# Patient Record
Sex: Female | Born: 1980 | Hispanic: Yes | Marital: Married | Smoking: Never smoker
Health system: Southern US, Community
[De-identification: ages and names within clinical notes are randomized; demographics above are authoritative.]

## PROBLEM LIST (undated history)

## (undated) DIAGNOSIS — I10 Essential (primary) hypertension: Secondary | ICD-10-CM

## (undated) DIAGNOSIS — R42 Dizziness and giddiness: Secondary | ICD-10-CM

## (undated) DIAGNOSIS — J329 Chronic sinusitis, unspecified: Secondary | ICD-10-CM

## (undated) HISTORY — DX: Chronic sinusitis, unspecified: J32.9

## (undated) HISTORY — DX: Dizziness and giddiness: R42

## (undated) HISTORY — DX: Essential (primary) hypertension: I10

---

## 2004-11-27 ENCOUNTER — Ambulatory Visit: Payer: Self-pay | Admitting: Family Medicine

## 2005-11-21 ENCOUNTER — Ambulatory Visit: Payer: Self-pay

## 2006-01-28 HISTORY — PX: BREAST BIOPSY: SHX20

## 2012-06-02 ENCOUNTER — Ambulatory Visit: Payer: Self-pay | Admitting: Family Medicine

## 2012-12-16 ENCOUNTER — Ambulatory Visit: Payer: Self-pay

## 2012-12-28 ENCOUNTER — Encounter: Payer: Self-pay | Admitting: *Deleted

## 2012-12-28 ENCOUNTER — Ambulatory Visit: Payer: Self-pay

## 2013-01-05 ENCOUNTER — Ambulatory Visit: Payer: Self-pay | Admitting: General Surgery

## 2013-02-04 ENCOUNTER — Ambulatory Visit (INDEPENDENT_AMBULATORY_CARE_PROVIDER_SITE_OTHER): Payer: PRIVATE HEALTH INSURANCE | Admitting: General Surgery

## 2013-02-04 ENCOUNTER — Encounter: Payer: Self-pay | Admitting: General Surgery

## 2013-02-04 VITALS — BP 122/64 | HR 76 | Resp 12 | Ht 64.0 in | Wt 160.0 lb

## 2013-02-04 DIAGNOSIS — M940 Chondrocostal junction syndrome [Tietze]: Secondary | ICD-10-CM

## 2013-02-04 DIAGNOSIS — N63 Unspecified lump in unspecified breast: Secondary | ICD-10-CM

## 2013-02-04 DIAGNOSIS — N644 Mastodynia: Secondary | ICD-10-CM

## 2013-02-04 NOTE — Patient Instructions (Addendum)
The patient is aware to use an antiinflammatory of choice (Advil or Aleve) as needed for comfort.Patient to return in six months right breast ultrasound.

## 2013-02-04 NOTE — Progress Notes (Signed)
Patient ID: Shelby Terrell, female   DOB: 06-Jun-1980, 33 y.o.   MRN: [id number]  Chief Complaint  Patient presents with  . Other    mammogram    HPI Shelby Terrell is a 33 y.o. female who presents for a breast evaluation. The most recent mammogram and ultrasound was done on 12/16/12 cat 3. Patient does perform regular self breast checks and gets regular mammograms done.  Patient states she feels a lump in her right breast and there is some pain, only last for a few seconds.She states the area has been tender for about six months. Patient had a right breast biopsy done in 2008 on the same right but in a different area. This was completed at [facility]. Results are not available. 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 This involves extensive upper extremity repetitive motion.  While the patient is a native of [place], her English is excellent. She is accompanied today by a [facility]opt[facility] translator [facility]. HPI  Past Medical History  Diagnosis Date  . Sinus infection   . Hypertension     Past Surgical History  Procedure Laterality Date  . [facility] Right [facility]    [facility]     Family History  Problem Relation Age of Onset  . Breast cancer Mother     passed at the age of 32.   . Breast cancer Maternal Aunt     Social History History  Substance Use Topics  . Smoking status: Never Smoker   . Smokeless tobacco: Never Used  . Alcohol Use: No    No Known Allergies  No current outpatient prescriptions on file.   No current facility-administered medications for this visit.    Review of Systems Review of Systems  Constitutional: Negative.   Respiratory: Negative.   Cardiovascular: Negative.     Blood pressure 122/64, pulse 76, resp. rate 12, height 5\' 4"  (1.626 m), weight 160 lb (72.576 kg), last menstrual period 01/16/2013.  Physical Exam Physical Exam  Constitutional: She is oriented to person, place, and time. She appears well-developed and  well-nourished.  Eyes: Conjunctivae are normal. No scleral icterus.  Neck: Neck supple.  Cardiovascular: Normal rate, regular rhythm and normal heart sounds.   Pulmonary/Chest: Effort normal and breath sounds normal. Right breast exhibits no inverted nipple, no mass, no nipple discharge, no skin change and no tenderness. Left breast exhibits no inverted nipple, no mass, no nipple discharge, no skin change and no tenderness.    Tender in the lower inner quadrant of the right breast   Lymphadenopathy:    She has no cervical adenopathy.    She has no axillary adenopathy.  Neurological: She is alert and oriented to person, place, and time.  Skin: Skin is warm.    Data Reviewed [facility]  Mammogram and ultrasound dated December 16, 2012 was reviewed. The mammograms were unremarkable. Ultrasound showed a 1 cm hypoechoic area smaller than noted in 2006. 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 At 3:00, 7 cm from the nipple a less than 0.9 cm hypoechoic area thought to represent fibrocystic changes was identified. This was recommended for a 6 month followup ultrasound exam. Consideration for MRI was offered by the radiologist.  Dayna BarkerGail Model testing is appropriate for those over age 32. If the patient was 2635, her estimated risk for breast cancer (lifetime) is less than 17%, below 20% threshold for American Cancer Society MRI screening recommendations.  Assessment  Costochondritis as the source of the patient's breast pain.     Plan    A followup office exam and ultrasound will be completed in 6 months        Earline Mayotte 02/06/2013, 7:26 AM

## 2013-02-06 DIAGNOSIS — N644 Mastodynia: Secondary | ICD-10-CM | POA: Insufficient documentation

## 2013-02-06 DIAGNOSIS — M940 Chondrocostal junction syndrome [Tietze]: Secondary | ICD-10-CM | POA: Insufficient documentation

## 2013-02-06 DIAGNOSIS — N63 Unspecified lump in unspecified breast: Secondary | ICD-10-CM | POA: Insufficient documentation

## 2013-08-04 ENCOUNTER — Other Ambulatory Visit: Payer: PRIVATE HEALTH INSURANCE

## 2013-08-04 ENCOUNTER — Ambulatory Visit (INDEPENDENT_AMBULATORY_CARE_PROVIDER_SITE_OTHER): Payer: PRIVATE HEALTH INSURANCE | Admitting: General Surgery

## 2013-08-04 ENCOUNTER — Encounter: Payer: Self-pay | Admitting: General Surgery

## 2013-08-04 VITALS — BP 122/72 | HR 82 | Resp 14 | Ht 64.0 in | Wt 172.0 lb

## 2013-08-04 DIAGNOSIS — N644 Mastodynia: Secondary | ICD-10-CM

## 2013-08-04 NOTE — Progress Notes (Signed)
Patient ID: Shelby Terrell, female   DOB: Jul 11, 1980, 33 y.o.   MRN: [id number]  Chief Complaint  Patient presents with  . Follow-up    office ultrasound    HPI Shelby RollsMaria Lugo Gwynneth Terrell is a 33 y.o. female.  Here today for follow up office ultrasound. She is currently 5 months pregnant. She still notices the "pitching" sensation in the right breast like before but only maybe twice a week. Admits to a small amount clear discharge right breast.  Medical translator from [facility], [place] here today for interpretation.  HPI  Past Medical History  Diagnosis Date  . Sinus infection   . Hypertension     Past Surgical History  Procedure Laterality Date  . [facility] biopsy Right [facility]    [facility]     Family History  Problem Relation Age of Onset  . Breast cancer Mother     passed at the age of 32.   . Breast cancer Maternal Aunt     Social History History  Substance Use Topics  . Smoking status: Never Smoker   . Smokeless tobacco: Never Used  . Alcohol Use: No    No Known Allergies  Current Outpatient Prescriptions  Medication Sig Dispense Refill  . Prenatal Vit-Fe Fumarate-FA (MULTIVITAMIN-PRENATAL) 27-0.8 MG TABS tablet Take 1 tablet by mouth daily at 12 noon.       No current facility-administered medications for this visit.    Review of Systems Review of Systems  Constitutional: Negative.   Respiratory: Negative.   Cardiovascular: Negative.     Blood pressure 122/72, pulse 82, resp. rate 14, height 5\' 4"  (1.626 m), weight 172 lb (78.019 kg), last menstrual period 02/26/2013.  Physical Exam Physical Exam  Constitutional: She is oriented to person, place, and time. She appears well-developed and well-nourished.  Neck: Neck supple.  Cardiovascular: Normal rate, regular rhythm and normal heart sounds.   Pulmonary/Chest: Effort normal and breath sounds normal. Right breast exhibits no inverted nipple, no mass, no nipple discharge, no skin change and no tenderness. Left breast  exhibits no inverted nipple, no mass, no nipple discharge, no skin change and no tenderness.  Lymphadenopathy:    She has no cervical adenopathy.    She has no axillary adenopathy.  Neurological: She is alert and oriented to person, place, and time.  Skin: Skin is warm and dry.    Data Reviewed Ultrasound examination of the right breast in the medial half again identifies a well circumscribed bi-lobed 0.3 x 0.4 x 0.5 cm area 6 cm from the nipple consistent with a fibroadenoma. At the 3:00 position 7 cm from the nipple no areas of architectural distortion or cystic or solid lesions are appreciated. BI-RAD-2.  Assessment    Benign breast exam.    Plan    The exam today is unremarkable, especially in light of her ongoing pregnancy.  Arrangements were made for a follow up exam in one year, earlier if the patient appreciates any changes in her breasts.     PCP: Abran RichardSelvidge, William M   Von Quint[place]ar W 08/07/2013, 9:17 AM

## 2013-08-04 NOTE — Patient Instructions (Signed)
Continue self breast exams. Call office for any new breast issues or concerns. 

## 2013-11-29 ENCOUNTER — Encounter: Payer: Self-pay | Admitting: General Surgery

## 2014-05-26 ENCOUNTER — Ambulatory Visit
Admit: 2014-05-26 | Disposition: A | Discharge status: Home / Self Care | Payer: Self-pay | Attending: Family Medicine | Admitting: Family Medicine

## 2014-07-21 ENCOUNTER — Telehealth: Payer: Self-pay | Admitting: *Deleted

## 2014-08-16 ENCOUNTER — Ambulatory Visit: Payer: PRIVATE HEALTH INSURANCE

## 2014-08-16 ENCOUNTER — Ambulatory Visit (INDEPENDENT_AMBULATORY_CARE_PROVIDER_SITE_OTHER): Payer: PRIVATE HEALTH INSURANCE | Admitting: General Surgery

## 2014-08-16 ENCOUNTER — Encounter: Payer: Self-pay | Admitting: General Surgery

## 2014-08-16 VITALS — BP 122/78 | HR 74 | Resp 12 | Ht 64.0 in | Wt 166.0 lb

## 2014-08-16 DIAGNOSIS — N644 Mastodynia: Secondary | ICD-10-CM | POA: Diagnosis not present

## 2014-08-16 NOTE — Progress Notes (Signed)
Patient ID: Shelby Terrell, female   DOB: [birth year], 34 y.o.   MRN: [id number]  Chief Complaint  Patient presents with  . Follow-up    right breast ultrasound.     HPI Shelby Terrell is a 34 y.o. female here today for a right breast ultrasound recall. Patient states she feels a pinching sensation in her right breast, only last for about 5 seconds. This occurs infrequently Patient states she has a 37nine month old baby boy and she is still producing some milk. She stop breast feeding 5  months ago.  Interpreter Marcela was present.  HPI  Past Medical History  Diagnosis Date  . Sinus infection   . Hypertension     Past Surgical History  Procedure Laterality Date  . [facility] biopsy Right [facility]    [facility]     Family History  Problem Relation Age of Onset  . Breast cancer Mother     passed at the age of 33.   . Breast cancer Maternal Aunt     Social History History  Substance Use Topics  . Smoking status: Never Smoker   . Smokeless tobacco: Never Used  . Alcohol Use: No    No Known Allergies  Current Outpatient Prescriptions  Medication Sig Dispense Refill  . fluticasone (FLONASE) 50 MCG/ACT nasal spray 2 sprays by Each Nare route daily.    Marland Kitchen. loratadine (CLARITIN) 10 MG tablet Take 10 mg by mouth.    . ranitidine (ZANTAC) 150 MG tablet Take 150 mg by mouth.     No current facility-administered medications for this visit.    Review of Systems Review of Systems  Constitutional: Negative.   Respiratory: Negative.   Cardiovascular: Negative.     Blood pressure 122/78, pulse 74, resp. rate 12, height 5\' 4"  (1.626 m), weight 166 lb (75.297 kg).  Physical Exam Physical Exam  Constitutional: She is oriented to person, place, and time. She appears well-developed and well-nourished.  HENT:  Mouth/Throat: Oropharynx is clear and moist. No oropharyngeal exudate.  Eyes: Conjunctivae are normal. No scleral icterus.  Neck: Neck supple.  Cardiovascular: Normal rate,  regular rhythm and normal heart sounds.   Pulmonary/Chest: Effort normal and breath sounds normal. Right breast exhibits no inverted nipple, no mass, no nipple discharge, no skin change and no tenderness. Left breast exhibits no inverted nipple, no mass, no nipple discharge, no skin change and no tenderness.  Lymphadenopathy:    She has no cervical adenopathy.    She has no axillary adenopathy.  Neurological: She is alert and oriented to person, place, and time.  Skin: Skin is warm and dry.    Data Reviewed Ultrasound examination of the right breast shows mildly prominent ductal structures up to 0.39 cm. No intraductal lesions were noted. Normal post lactation breast parenchyma was appreciated. The previously identified 6 mm fibroadenoma in the 2:00 position is not visualized on today's exam. BI-RADS-2.  Assessment    Benign breast exam.    Plan    The patient was reassured that the milky drainage should resolve over the coming months. She should report if she experiences any new changes in her breast exam. Follow up otherwise will be on an as-needed basis.    Patient to return as needed. PCP:  Colonel BaldSelvidge   Xyon Lukasik W 08/16/2014, 9:35 PM

## 2014-08-16 NOTE — Patient Instructions (Signed)
Patient to return as needed. 

## 2014-08-24 NOTE — Telephone Encounter (Signed)
confirmed

## 2016-10-14 ENCOUNTER — Ambulatory Visit
Admission: RE | Admit: 2016-10-14 | Discharge: 2016-10-14 | Disposition: A | Discharge status: Home / Self Care | Payer: Self-pay | Source: Ambulatory Visit | Attending: Oncology | Admitting: Oncology

## 2016-10-14 ENCOUNTER — Encounter (INDEPENDENT_AMBULATORY_CARE_PROVIDER_SITE_OTHER): Payer: Self-pay

## 2016-10-14 ENCOUNTER — Ambulatory Visit: Payer: Self-pay | Attending: Oncology

## 2016-10-14 VITALS — BP 130/82 | HR 88 | Temp 99.1°F | Ht 65.0 in | Wt 168.0 lb

## 2016-10-14 DIAGNOSIS — Z0000 Encounter for general adult medical examination without abnormal findings: Secondary | ICD-10-CM

## 2016-10-14 NOTE — Progress Notes (Signed)
Subjective:     Patient ID: Shelby Terrell, female   DOB: 1980/10/27, 36 y.o.   MRN: [id number]  HPI   Review of Systems     Objective:   Physical Exam  Pulmonary/Chest: Right breast exhibits no inverted nipple, no mass, no nipple discharge, no skin change and no tenderness. Left breast exhibits no inverted nipple, no mass, no nipple discharge, no skin change and no tenderness. Breasts are symmetrical.       Assessment:     36 year old hispanic patient,fluent in English,presents for [facility] clinic visit.  Patient's mother was diagnosed with breast cancer at 75, and died at age 26.  Patient has been followed by Dr. Lemar Livings with Ultrasound.  Last ultrasound July 2016 with Birads 2 result.  Patient screened, and meets [facility] eligibility.  Patient does not have insurance, Medicare or Medicaid.  Handout given on Affordable Care Act.Instructed patient on breast self-exam using teach back method.  CBE unremarkable.  No mass or lump palpated.  Patient reports she has intermittent pinching feeling bilateral breasts.  States she  Does drink a lot of coffee.  Relationship between breast tenderness and caffeine reviewed with patient.  States she has not felt lump in right breast since her 30 year old child was born.  Patient also has a 66 year old son.  She works part-time as a Engineer, structural for elderly.    Plan:     Sent for bilateral screening mammogram.

## 2016-10-21 ENCOUNTER — Inpatient Hospital Stay
Admission: RE | Admit: 2016-10-21 | Discharge: 2016-10-21 | Disposition: A | Discharge status: Home / Self Care | Payer: Self-pay | Source: Ambulatory Visit | Attending: *Deleted | Admitting: *Deleted

## 2016-10-21 ENCOUNTER — Other Ambulatory Visit: Payer: Self-pay | Admitting: *Deleted

## 2016-10-21 DIAGNOSIS — Z9289 Personal history of other medical treatment: Secondary | ICD-10-CM

## 2016-12-01 NOTE — Progress Notes (Signed)
Letter mailed from [facility] to notify of normal mammogram results.  Patient to return in one year for annual screening.  Copy to [facility]. 

## 2017-10-08 IMAGING — MG MM DIGITAL SCREENING BILAT W/ TOMO W/ CAD
8 of 12 series · 8 of 28 positions shown · non-contrast
Comparison: Previous exam(s).

CLINICAL DATA: Screening.

EXAM:
2D DIGITAL SCREENING BILATERAL MAMMOGRAM WITH CAD AND ADJUNCT TOMO

[L CC synth-2D]
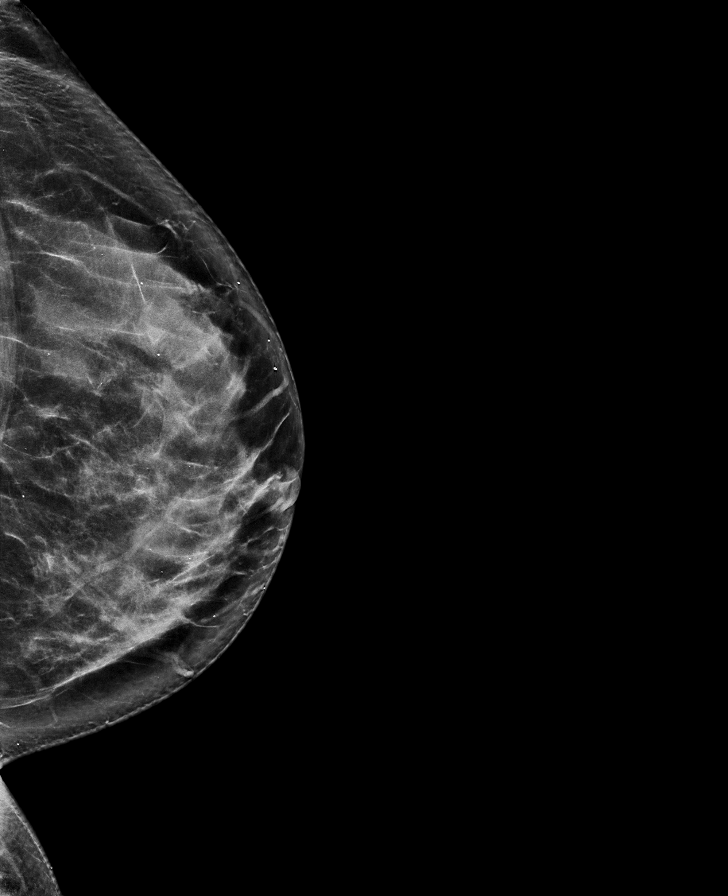

[L CC]
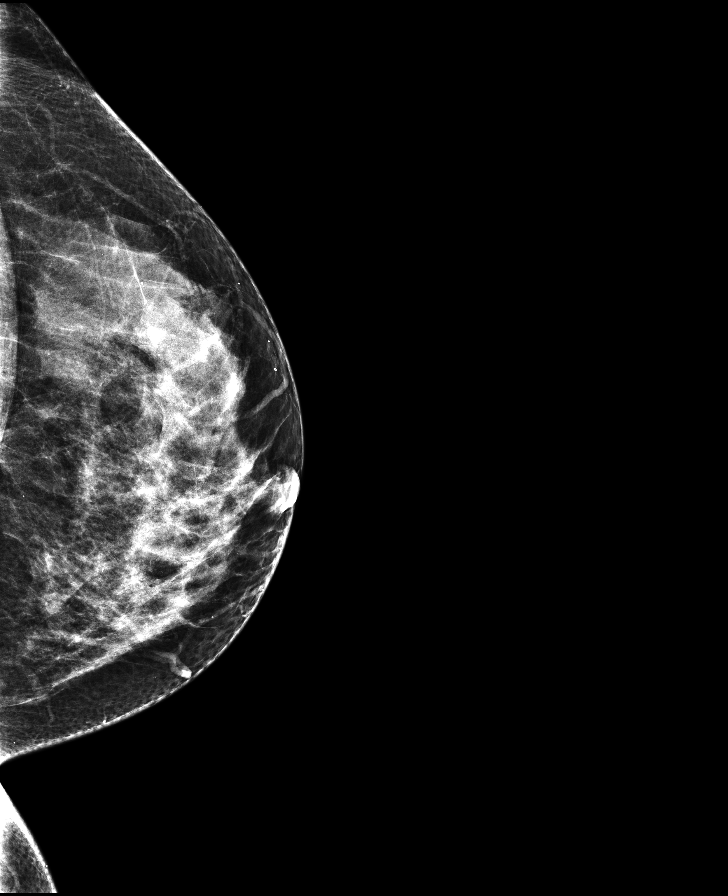

[L MLO synth-2D]
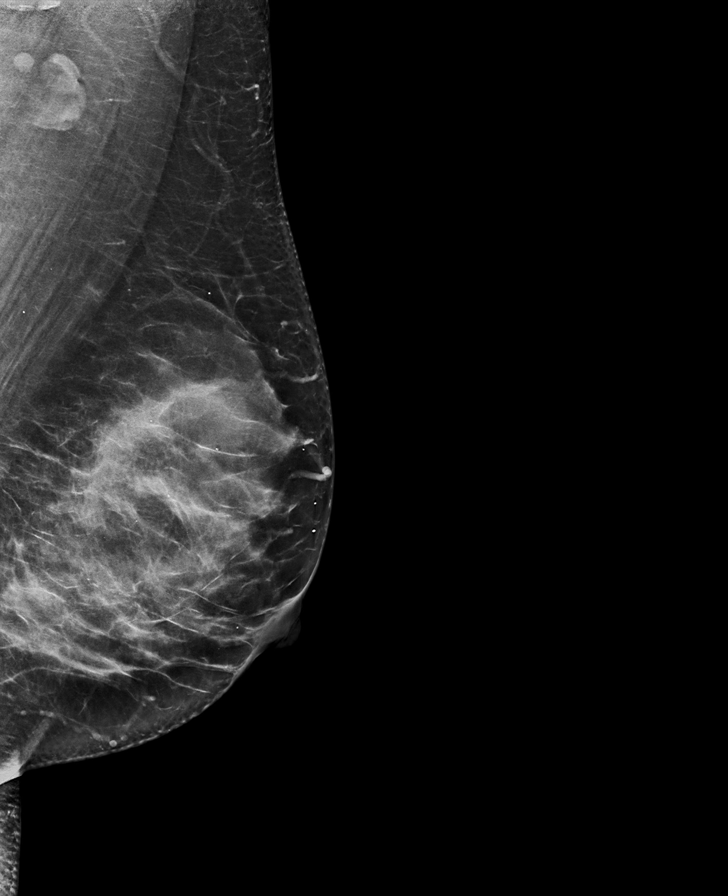

[L MLO]
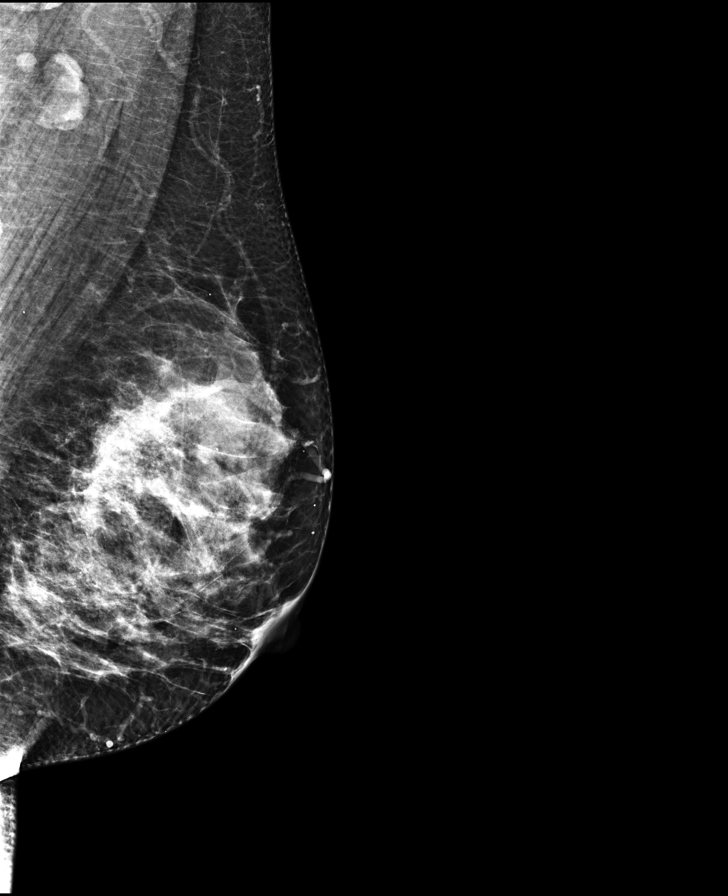

[R MLO synth-2D]
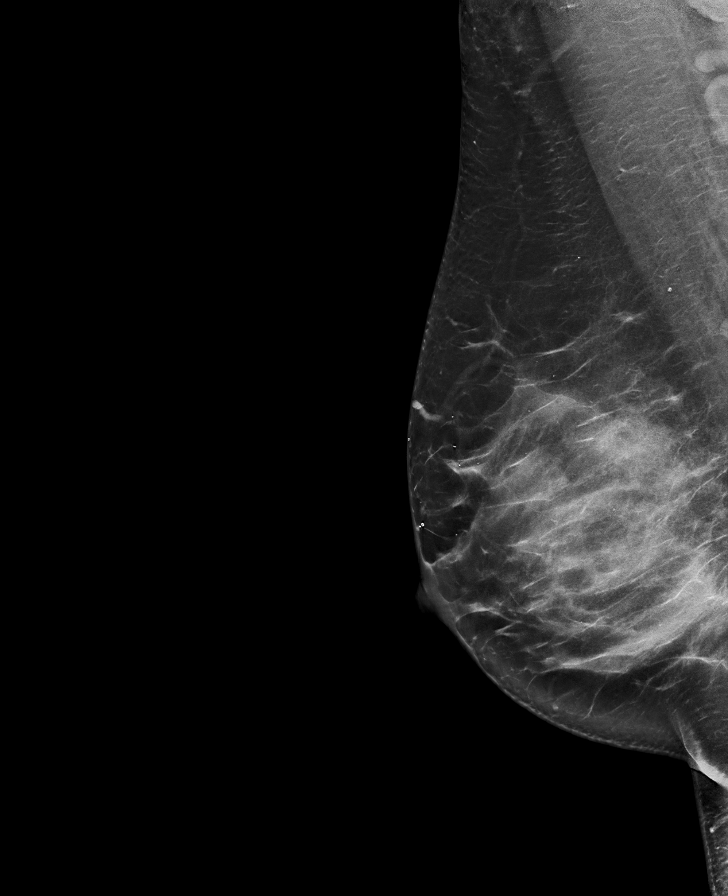

[R CC synth-2D]
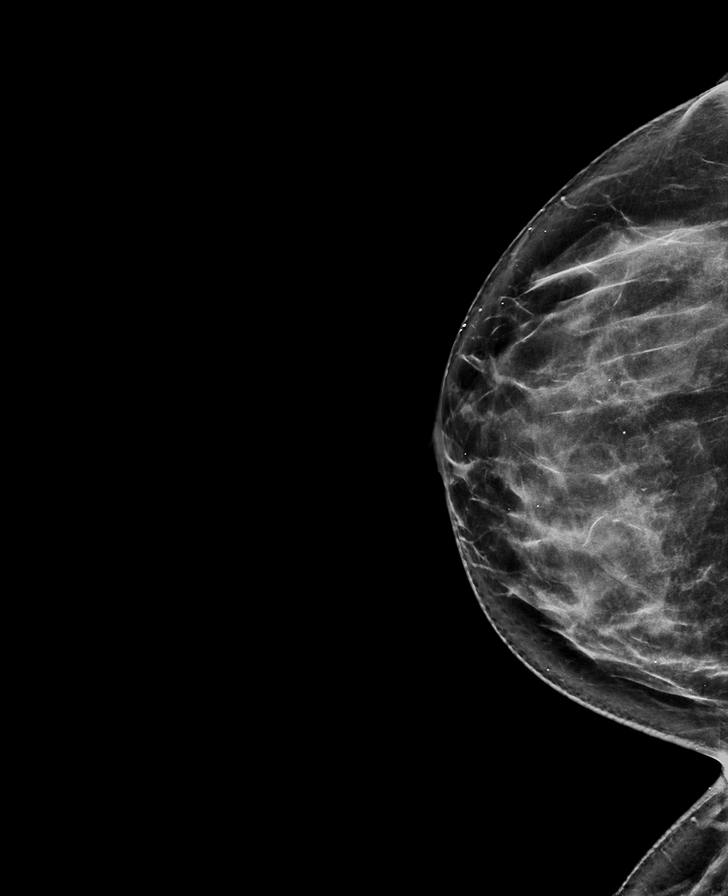

[R CC]
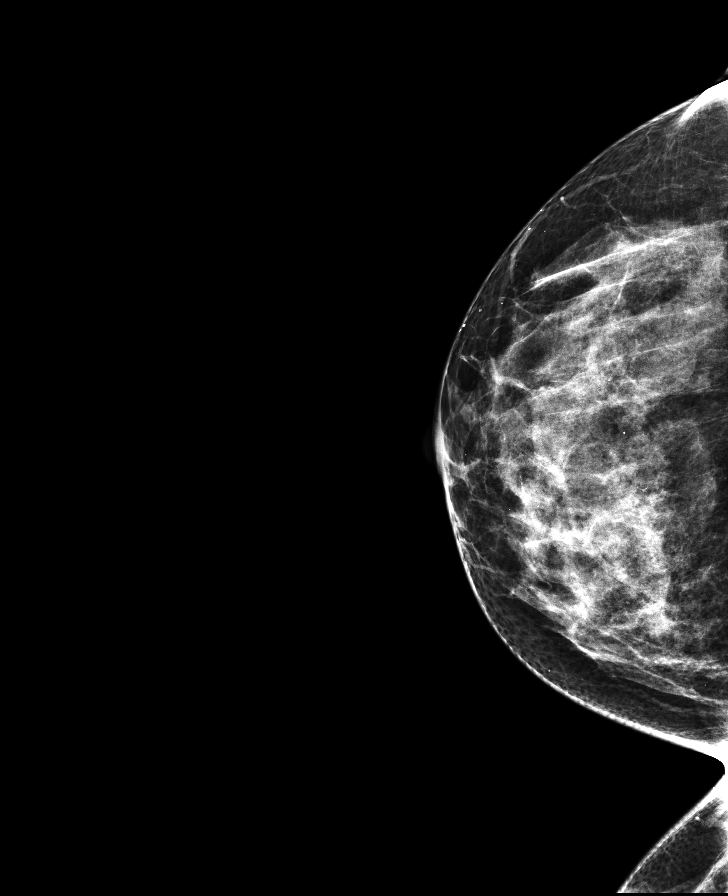

[R MLO]
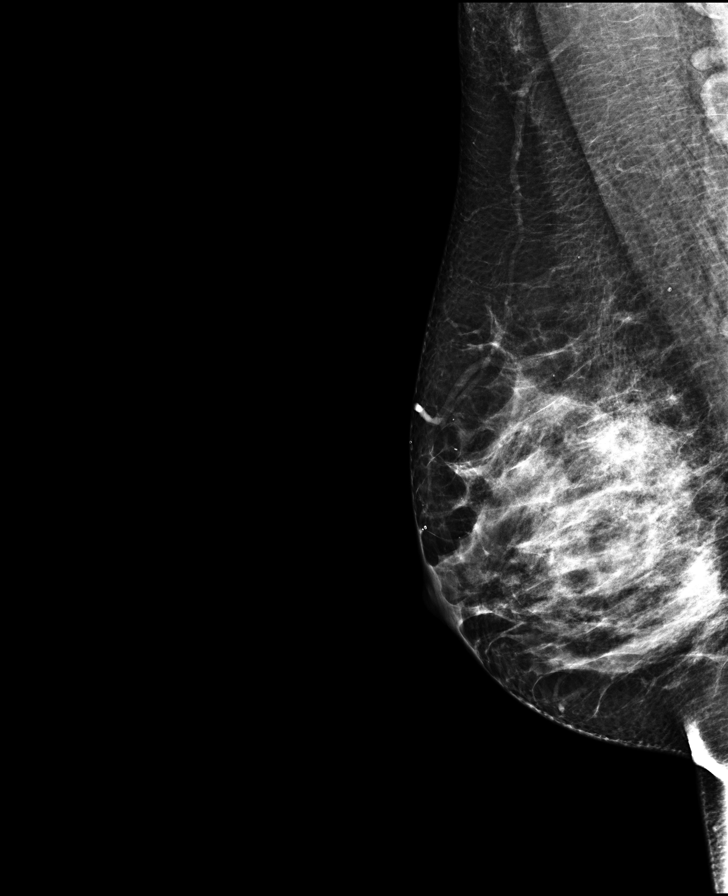

[8 of 28 positions shown; findings below may reference images not displayed]

ACR Breast Density Category c: The breast tissue is heterogeneously
dense, which may obscure small masses.
FINDINGS: There are no findings suspicious for malignancy. Images were
processed with CAD.
IMPRESSION: No mammographic evidence of malignancy. A result letter of this
screening mammogram will be mailed directly to the patient.

RECOMMENDATION:
Screening mammogram at age 41. (Code:B7-7-C2P)

BI-RADS CATEGORY  1: Negative.

## 2018-06-17 ENCOUNTER — Other Ambulatory Visit: Payer: Self-pay

## 2018-06-17 ENCOUNTER — Encounter: Payer: Self-pay | Admitting: Emergency Medicine

## 2018-06-17 ENCOUNTER — Ambulatory Visit
Admission: EM | Admit: 2018-06-17 | Discharge: 2018-06-17 | Disposition: A | Discharge status: Other Acute Inpatient Hospital | Payer: Self-pay

## 2018-06-17 DIAGNOSIS — R202 Paresthesia of skin: Secondary | ICD-10-CM

## 2018-06-17 DIAGNOSIS — R002 Palpitations: Secondary | ICD-10-CM

## 2018-06-17 DIAGNOSIS — R079 Chest pain, unspecified: Secondary | ICD-10-CM

## 2018-06-17 MED ORDER — ASPIRIN 81 MG PO CHEW
324.0000 mg | CHEWABLE_TABLET | Freq: Once | ORAL | Status: AC
  Administered 2018-06-17: 324 mg via ORAL

## 2018-06-17 NOTE — ED Provider Notes (Signed)
[facility] ____________________________________________  Time seen: Approximately 2:49 PM  I have reviewed the triage vital signs and the nursing notes.   HISTORY  Chief Complaint Headache (APPT); Chest Pain; and Numbness  Interpreter: Spanish interpreter used.  HPI Shelby Terrell is a 38 y.o. female presenting for evaluation of neck pain, chest pain and left arm sensation changes.  Patient reports yesterday morning at 10 AM she was standing washing dishes and felt sudden onset of pain to the back of her head and neck that within 15 minutes change positions to left chest pressure pain and left arm numbness.  Patient reports the chest heaviness and neck pain remained at a 7 out of 10 for 2 to 3 hours and has improved some.  States currently having mild left chest heaviness and pressure.  Intermittent palpitation sensation.  Also currently having diffuse left arm heaviness, slight decrease sensation and sensation like she overworked her arm.  Does report brief episode of shortness of breath yesterday at start of symptoms, no other shortness of breath.  Has been increasingly "gassy "and belching since this began yesterday.  Use some chamomile tea which helped with gassiness.  No injury or recent strenuous activity.  Denies history of similar.  Denies current anxiety, but does report recent stress from attending a funeral.  Denies cough, fevers, recent sickness.  Not a smoker.  No oral contraceptives.  Arlyss QueenSelvidge, William M, MD: PCP Patient's last menstrual period was 06/07/2018 (approximate).  Denies pregnancy   Past Medical History:  Diagnosis Date  .    Marland Kitchen. Sinus infection     Patient Active Problem List   Diagnosis Date Noted  . Costochondritis 02/06/2013  . Breast pain, right 02/06/2013  . Breast mass in female 02/06/2013    Past Surgical History:  Procedure Laterality Date  . BREAST [facility]OPSY [facility]   [facility]      No current facility-administered medications  for this encounter.   Current Outpatient Medications:  .  Garlic 10 MG CAPS, Take by mouth., Disp: , Rfl:  .  fluticasone (FLONASE) 50 MCG/ACT nasal spray, 2 sprays by Each Nare route daily., Disp: , Rfl:  .  loratadine (CLARITIN) 10 MG tablet, Take 10 mg by mouth., Disp: , Rfl:  .  Prenatal Vit-Fe Fumarate-FA (PNV PRENATAL PLUS MULTIVITAMIN) 27-1 MG TABS, Take by mouth., Disp: , Rfl:  .  ranitidine (ZANTAC) 150 MG tablet, Take 150 mg by mouth., Disp: , Rfl:   Allergies Patient has no known allergies.  Family History  Problem Relation Age of Onset  . Breast cancer Mother        passed at the age of 37.   . Breast cancer Maternal Aunt   . Uterine cancer Maternal Aunt   Father: DM  Social History Social History   Tobacco Use  . Smoking status: Never Smoker  . Smokeless tobacco: Never Used  Substance Use Topics  . Alcohol use: No  . Drug use: No    Review of Systems Constitutional: No fever Cardiovascular: Positive chest pain. Respiratory: Positive shortness of breath. Gastrointestinal: No abdominal pain.  No nausea, no vomiting.   Genitourinary: Negative for dysuria. Musculoskeletal: Negative for back pain. Skin: Negative for rash. Neurological: Positive for headache.  Positive left arm sensation changes.    ____________________________________________   PHYSICAL EXAM:  VITAL SIGNS: ED Triage Vitals  Enc Vitals Group     BP 06/17/18 1353 (!) 135/91     Pulse Rate 06/17/18 1353 79  Resp 06/17/18 1353 18     Temp 06/17/18 1353 98.9 F (37.2 C)     Temp Source 06/17/18 1353 Oral     SpO2 06/17/18 1353 100 %     Weight 06/17/18 1347 164 lb (74.4 kg)     Height 06/17/18 1347 5\' 4"  (1.626 m)     Head Circumference --      Peak Flow --      Pain Score 06/17/18 1347 2     Pain Loc --      Pain Edu? --      Excl. in GC? --     Constitutional: Alert and oriented. Well appearing and in no acute distress. ENT      Head: Normocephalic and atraumatic.  Cardiovascular: Normal rate, regular rhythm. Grossly normal heart sounds.  Good peripheral circulation. Respiratory: Normal respiratory effort without tachypnea nor retractions. Breath sounds are clear and equal bilaterally. No wheezes, rales, rhonchi. Musculoskeletal:   No midline cervical, thoracic or lumbar tenderness to palpation.  Mild left trapezius tenderness palpation.  Bilateral hand grip strong and equal.  Bilateral distal radial pulses equal.  No extremity edema noted. Neurologic:  Normal speech and language. No gross focal neurologic deficits are appreciated. Speech is normal. No gait instability.  Mild decrease sensation to left arm compared to right. Skin:  Skin is warm, dry and intact. No rash noted. Psychiatric: Mood and affect are normal. Speech and behavior are normal. Patient exhibits appropriate insight and judgment   ___________________________________________   LABS (all labs ordered are listed, but only abnormal results are displayed)  Labs Reviewed - No data to display ____________________________________________  EKG  ED ECG REPORT I, Renford Dills, the attending provider, personally viewed and interpreted this ECG.   Date: 06/17/2018  EKG Time: 1402  Rate: 74  Rhythm: normal sinus rhythm with sinus arrhythmia  Axis: normal  Intervals:none  ST&T Change: none  RADIOLOGY  No results found. ____________________________________________   PROCEDURES Procedures     INITIAL IMPRESSION / ASSESSMENT AND PLAN / ED COURSE  Pertinent labs & imaging results that were available during my care of the patient were reviewed by me and considered in my medical decision making (see chart for details).  Patient presenting for evaluation of the above complaints with continued left chest discomfort and left arm sensation changes.  Discussed multiple differentials including musculoskeletal strain, cervical radiculopathy, anxiety, ACS, abdominal etiology.  As symptoms  have persisted recommend further evaluation in emergency room at this time.  Patient declined EMS and states that she will have her sister take her.  324 mg aspirin given once in urgent care.  Patient stable at time of discharge and agrees with plan. ____________________________________________   FINAL CLINICAL IMPRESSION(S) / ED DIAGNOSES  Final diagnoses:  Chest pain, unspecified type  Arm paresthesia, left  Palpitations     ED Discharge Orders    None       Note: This dictation was prepared with Dragon dictation along with smaller phrase technology. Any transcriptional errors that result from this process are unintentional.         Renford Dills, NP 06/17/18 1517

## 2018-06-17 NOTE — ED Triage Notes (Signed)
Pt c/o pain in the back of her head and she could not move her neck, pain in her chest, left arm and she had shortness of breath. Pain was a numbness and heavy feeling.  This occurred yesterday at about 10 am. She still has numbness in her left arm, the pain has subsided but she has fatigue. She also felt like she had gas in her stomach and she was burping a lot the last 2 days. She also states she has been having palpitations.

## 2018-06-17 NOTE — Discharge Instructions (Addendum)
Go directly to emergency room as discussed.  °

## 2020-05-21 ENCOUNTER — Other Ambulatory Visit: Payer: Self-pay

## 2020-05-21 DIAGNOSIS — Z8616 Personal history of COVID-19: Secondary | ICD-10-CM | POA: Insufficient documentation

## 2020-05-21 DIAGNOSIS — Z28310 Unvaccinated for covid-19: Secondary | ICD-10-CM | POA: Insufficient documentation

## 2020-05-21 DIAGNOSIS — R002 Palpitations: Secondary | ICD-10-CM | POA: Insufficient documentation

## 2020-05-21 DIAGNOSIS — I10 Essential (primary) hypertension: Secondary | ICD-10-CM | POA: Insufficient documentation

## 2020-05-21 DIAGNOSIS — R0789 Other chest pain: Secondary | ICD-10-CM | POA: Insufficient documentation

## 2020-05-21 DIAGNOSIS — R0602 Shortness of breath: Secondary | ICD-10-CM | POA: Insufficient documentation

## 2020-05-21 DIAGNOSIS — Z20822 Contact with and (suspected) exposure to covid-19: Secondary | ICD-10-CM | POA: Insufficient documentation

## 2020-05-21 NOTE — ED Triage Notes (Signed)
Pt states has felt palpitations since yesterday. Pt states began to have chest pain and shob for 30 minutes. Pt appears very anxious, is shaking, but able to answer all questions, resps unlabored. Skin normal color warm and dry.

## 2020-05-22 ENCOUNTER — Encounter: Payer: Self-pay | Admitting: Radiology

## 2020-05-22 ENCOUNTER — Emergency Department
Admission: EM | Admit: 2020-05-22 | Discharge: 2020-05-22 | Disposition: A | Discharge status: Home / Self Care | Payer: Self-pay | Attending: Emergency Medicine | Admitting: Emergency Medicine

## 2020-05-22 ENCOUNTER — Emergency Department: Payer: Self-pay

## 2020-05-22 DIAGNOSIS — R0789 Other chest pain: Secondary | ICD-10-CM

## 2020-05-22 DIAGNOSIS — R002 Palpitations: Secondary | ICD-10-CM

## 2020-05-22 LAB — CBC
HCT: 39.3 % (ref 36.0–46.0)
Hemoglobin: 13.4 g/dL (ref 12.0–15.0)
MCH: 30.7 pg (ref 26.0–34.0)
MCHC: 34.1 g/dL (ref 30.0–36.0)
MCV: 89.9 fL (ref 80.0–100.0)
Platelets: 362 10*3/uL (ref 150–400)
RBC: 4.37 MIL/uL (ref 3.87–5.11)
RDW: 12.7 % (ref 11.5–15.5)
WBC: 9.4 10*3/uL (ref 4.0–10.5)
nRBC: 0 % (ref 0.0–0.2)

## 2020-05-22 LAB — BASIC METABOLIC PANEL
Anion gap: 10 (ref 5–15)
BUN: 9 mg/dL (ref 6–20)
CO2: 24 mmol/L (ref 22–32)
Calcium: 9.2 mg/dL (ref 8.9–10.3)
Chloride: 104 mmol/L (ref 98–111)
Creatinine, Ser: 0.5 mg/dL (ref 0.44–1.00)
GFR, Estimated: >60 mL/min (ref >60)
Glucose, Bld: 127 mg/dL — ABNORMAL HIGH (ref 70–99)
Potassium: 3.4 mmol/L — ABNORMAL LOW (ref 3.5–5.1)
Sodium: 138 mmol/L (ref 135–145)

## 2020-05-22 LAB — D-DIMER, QUANTITATIVE: D-Dimer, Quant: 0.34 ug/mL-FEU (ref 0.00–0.50)

## 2020-05-22 LAB — TROPONIN I (HIGH SENSITIVITY)
Troponin I (High Sensitivity): 3 ng/L (ref <18)
Troponin I (High Sensitivity): 3 ng/L (ref <18)

## 2020-05-22 LAB — RESP PANEL BY RT-PCR (FLU A&B, COVID) ARPGX2
Influenza A by PCR: NEGATIVE
Influenza B by PCR: NEGATIVE
SARS Coronavirus 2 by RT PCR: NEGATIVE

## 2020-05-22 LAB — T4, FREE: Free T4: 0.98 ng/dL (ref 0.61–1.12)

## 2020-05-22 LAB — TSH: TSH: 1.301 u[IU]/mL (ref 0.350–4.500)

## 2020-05-22 NOTE — Discharge Instructions (Signed)
Return to the ER for worsening symptoms, persistent vomiting, difficulty breathing or other concerns. °

## 2020-05-22 NOTE — ED Provider Notes (Signed)
[facility] Emergency Department Provider Note   ____________________________________________   Event Date/Time   First MD Initiated Contact with Patient 05/22/20 0036     (approximate)  I have reviewed the triage vital signs and the nursing notes.   HISTORY  Chief Complaint Shortness of Breath and Chest Pain    HPI Shelby Terrell is a 40 y.o. female who presents to the ED from home with a chief complaint of palpitations since yesterday.  Began to have chest pain and shortness of breath for 30 minutes prior to arrival while getting ready for bed.  Arrived at triage anxious and shaky.  Currently feeling better and appears more calm.  Reports recent cold-like symptoms 2 weeks ago; had a negative rapid antigen COVID test.  She is unvaccinated and has had COVID-19 twice.  Denies recent travel, trauma or hormone use.  Drinks 1 cup of coffee daily.  Denies energy drinks.     Past Medical History:  Diagnosis Date  . Hypertension   . Sinus infection     Patient Active Problem List   Diagnosis Date Noted  . Costochondritis 02/06/2013  . Breast pain, right 02/06/2013  . Breast mass in female 02/06/2013    Past Surgical History:  Procedure Laterality Date  . BREAST BIOPSY Right 2008   [facility]     Prior to Admission medications   Medication Sig Start Date End Date Taking? Authorizing Provider  fluticasone (FLONASE) 50 MCG/ACT nasal spray 2 sprays by Each Nare route daily.    [provider]  Garlic 10 MG CAPS Take by mouth.    [provider]  loratadine (CLARITIN) 10 MG tablet Take 10 mg by mouth.    [provider]  Prenatal Vit-Fe Fumarate-FA (PNV PRENATAL PLUS MULTIVITAMIN) 27-1 MG TABS Take by mouth.    [provider]  ranitidine (ZANTAC) 150 MG tablet Take 150 mg by mouth.    [provider]    Allergies Aspirin  Family History  Problem Relation Age of Onset  . Breast cancer Mother         passed at the age of 77.   . Breast cancer Maternal Aunt   . Uterine cancer Maternal Aunt     Social History Social History   Tobacco Use  . Smoking status: Never Smoker  . Smokeless tobacco: Never Used  Vaping Use  . Vaping Use: Never used  Substance Use Topics  . Alcohol use: No  . Drug use: No    Review of Systems  Constitutional: No fever/chills Eyes: No visual changes. ENT: No sore throat. Cardiovascular: Positive for palpitations and chest pain. Respiratory: Positive for shortness of breath. Gastrointestinal: No abdominal pain.  No nausea, no vomiting.  No diarrhea.  No constipation. Genitourinary: Negative for dysuria. Musculoskeletal: Negative for back pain. Skin: Negative for rash. Neurological: Negative for headaches, focal weakness or numbness.   ____________________________________________   PHYSICAL EXAM:  VITAL SIGNS: ED Triage Vitals  Enc Vitals Group     BP 05/21/20 2354 (!) 143/75     Pulse Rate 05/21/20 2354 98     Resp 05/21/20 2354 20     Temp --      Temp Source 05/21/20 2354 Oral     SpO2 05/21/20 2354 100 %     Weight 05/21/20 2355 170 lb (77.1 kg)     Height 05/21/20 2355 5\' 4"  (1.626 m)     Head Circumference --      Peak  Flow --      Pain Score 05/21/20 2355 3     Pain Loc --      Pain Edu? --      Excl. in GC? --     Constitutional: Alert and oriented. Well appearing and in no acute distress. Eyes: Conjunctivae are normal. PERRL. EOMI. Head: Atraumatic. Nose: No congestion/rhinnorhea. Mouth/Throat: Mucous membranes are moist.   Neck: No stridor.  No thyromegaly. Cardiovascular: Normal rate, regular rhythm. Grossly normal heart sounds.  Good peripheral circulation. Respiratory: Normal respiratory effort.  No retractions. Lungs CTAB. Gastrointestinal: Soft and nontender. No distention. No abdominal bruits. No CVA tenderness. Musculoskeletal: No lower extremity tenderness nor edema.  No joint effusions. Neurologic:  Normal  speech and language. No gross focal neurologic deficits are appreciated.  Skin:  Skin is warm, dry and intact. No rash noted. Psychiatric: Mood and affect are normal. Speech and behavior are normal.  ____________________________________________   LABS (all labs ordered are listed, but only abnormal results are displayed)  Labs Reviewed  BASIC METABOLIC PANEL - Abnormal; Notable for the following components:      Result Value   Potassium 3.4 (*)    Glucose, Bld 127 (*)    All other components within normal limits  RESP PANEL BY RT-PCR (FLU A&B, COVID) ARPGX2  CBC  TSH  T4, FREE  D-DIMER, QUANTITATIVE  POC URINE PREG, ED  TROPONIN I (HIGH SENSITIVITY)  TROPONIN I (HIGH SENSITIVITY)   ____________________________________________  EKG  ED ECG REPORT I, Elly Haffey J, the attending physician, personally viewed and interpreted this ECG.   Date: 05/22/2020  EKG Time: 2355  Rate: 86  Rhythm: normal EKG, normal sinus rhythm  Axis: Normal  Intervals:none  ST&T Change: Nonspecific  ____________________________________________  RADIOLOGY I, Danise Dehne J, personally viewed and evaluated these images (plain radiographs) as part of my medical decision making, as well as reviewing the written report by the radiologist.  ED MD interpretation: No acute cardiopulmonary process  Official radiology report(s): DG Chest 2 View  Result Date: 05/22/2020 CLINICAL DATA:  Palpitations since yesterday.  Chest pain today. EXAM: CHEST - 2 VIEW COMPARISON:  None. FINDINGS: The heart size and mediastinal contours are within normal limits. Both lungs are clear. The visualized skeletal structures are unremarkable. IMPRESSION: No active cardiopulmonary disease. Electronically Signed   By: Burman Nieves M.D.   On: 05/22/2020 01:06    ____________________________________________   PROCEDURES  Procedure(s) performed (including Critical  Care):  Procedures   ____________________________________________   INITIAL IMPRESSION / ASSESSMENT AND PLAN / ED COURSE  As part of my medical decision making, I reviewed the following data within the electronic MEDICAL RECORD NUMBER Nursing notes reviewed and incorporated, Labs reviewed, EKG interpreted, Old chart reviewed, Radiograph reviewed and Notes from prior ED visits     40 year old female presenting with chest pain and palpitations, now resolved Differential diagnosis includes, but is not limited to, ACS, aortic dissection, pulmonary embolism, cardiac tamponade, pneumothorax, pneumonia, pericarditis, myocarditis, GI-related causes including esophagitis/gastritis, and musculoskeletal chest wall pain.    Initial cardiac panel unremarkable.  Will repeat troponin, check D-dimer, thyroid panel and reassess.  Clinical Course as of 05/22/20 0435  Mon May 22, 2020  0320 Patient resting in no acute distress.  Updated her of all test results.  Strict return precautions given.  Patient verbalizes understanding agrees with plan of care. [JS]    Clinical Course User Index [JS] Irean Hong, MD     ____________________________________________   FINAL CLINICAL IMPRESSION(S) /  ED DIAGNOSES  Final diagnoses:  Atypical chest pain  Palpitations     ED Discharge Orders    None      *Please note:  Shelby Terrell was evaluated in Emergency Department on 05/22/2020 for the symptoms described in the history of present illness. She was evaluated in the context of the global COVID-19 pandemic, which necessitated consideration that the patient might be at risk for infection with the SARS-CoV-2 virus that causes COVID-19. Institutional protocols and algorithms that pertain to the evaluation of patients at risk for COVID-19 are in a state of rapid change based on information released by regulatory bodies including the CDC and federal and state organizations. These policies and algorithms were  followed during the patient's care in the ED.  Some ED evaluations and interventions may be delayed as a result of limited staffing during and the pandemic.*   Note:  This document was prepared using Dragon voice recognition software and may include unintentional dictation errors.   Irean Hong, MD 05/22/20 [phone]

## 2022-09-25 ENCOUNTER — Encounter: Payer: Self-pay | Admitting: Obstetrics and Gynecology

## 2022-09-25 ENCOUNTER — Other Ambulatory Visit: Payer: Self-pay | Admitting: Hematology and Oncology

## 2022-09-25 DIAGNOSIS — Z803 Family history of malignant neoplasm of breast: Secondary | ICD-10-CM

## 2022-09-27 ENCOUNTER — Telehealth: Payer: Self-pay | Admitting: Genetic Counselor

## 2022-10-01 ENCOUNTER — Other Ambulatory Visit: Payer: Self-pay | Admitting: *Deleted

## 2022-10-01 ENCOUNTER — Inpatient Hospital Stay
Admission: RE | Admit: 2022-10-01 | Discharge: 2022-10-01 | Disposition: A | Discharge status: Home / Self Care | Payer: Self-pay | Source: Ambulatory Visit | Attending: Obstetrics and Gynecology | Admitting: Obstetrics and Gynecology

## 2022-10-01 DIAGNOSIS — Z1231 Encounter for screening mammogram for malignant neoplasm of breast: Secondary | ICD-10-CM

## 2022-10-15 ENCOUNTER — Other Ambulatory Visit: Payer: Self-pay

## 2022-12-16 ENCOUNTER — Other Ambulatory Visit: Payer: Self-pay | Admitting: Obstetrics and Gynecology

## 2022-12-16 ENCOUNTER — Ambulatory Visit: Payer: Self-pay | Attending: Hematology and Oncology | Admitting: Hematology and Oncology

## 2022-12-16 ENCOUNTER — Ambulatory Visit
Admission: RE | Admit: 2022-12-16 | Discharge: 2022-12-16 | Disposition: A | Discharge status: Home / Self Care | Payer: Self-pay | Source: Ambulatory Visit | Attending: Obstetrics and Gynecology | Admitting: Obstetrics and Gynecology

## 2022-12-16 VITALS — BP 115/79 | Wt 178.3 lb

## 2022-12-16 DIAGNOSIS — Z1231 Encounter for screening mammogram for malignant neoplasm of breast: Secondary | ICD-10-CM | POA: Insufficient documentation

## 2022-12-16 NOTE — Progress Notes (Signed)
[name] is a 42 y.o. female who presents to [facility] clinic today with no complaints.    Pap Smear: Pap not smear completed today. Last Pap smear was 07/22/2019 at [facility] clinic and was normal. Per patient has no history of an abnormal Pap smear. Last Pap smear result is available in Epic.   Physical exam: Breasts Breasts symmetrical. No skin abnormalities bilateral breasts. No nipple retraction bilateral breasts. No nipple discharge bilateral breasts. No lymphadenopathy. No lumps palpated bilateral breasts.       Pelvic/Bimanual Pap is not indicated today    Smoking History: Patient has never smoked and was not referred to quit line.    Patient Navigation: Patient education provided. Access to services provided for patient through [facility] program. Shelby Terrell interpreter provided. No transportation provided   Colorectal Cancer Screening: Per patient has never had colonoscopy completed No complaints today.    Breast and Cervical Cancer Risk Assessment: Patient does not have family history of breast cancer, known genetic mutations, or radiation treatment to the chest before age 46. Patient does not have history of cervical dysplasia, immunocompromised, or DES exposure in-utero.  Risk Scores as of Encounter on 12/16/2022     Shelby Terrell           5-year 1.84%   Lifetime 21.85%   This patient is [place] but has no documented birth country, so the [place] model used data from [place] patients to calculate their risk score. Document a birth country in the Demographics activity for a more accurate score.         Last calculated by Shelby Rutherford, LPN on 51/88/4166 at  2:09 PM          A: [facility] exam without pap smear No complaints with benign exam.   P: Referred patient to the [facility] for a screening mammogram. Appointment scheduled 12/16/22.  Shelby Terrell A, NP 12/16/2022 1:32 PM

## 2022-12-16 NOTE — Patient Instructions (Signed)
Taught Shelby Terrell about self breast awareness and gave educational materials to take home. Patient did not need a Pap smear today due to last Pap smear was in 07/22/2019 per patient. Let her know [facility] will cover Pap smears every 5 years unless has a history of abnormal Pap smears. Referred patient to the [facility] for screening mammogram. Appointment scheduled for 12/16/2022. Patient aware of appointment and will be there. Let patient know will follow up with her within the next couple weeks with results. Shelby Terrell verbalized understanding.  Shelby Lux, NP 1:34 PM

## 2022-12-19 ENCOUNTER — Other Ambulatory Visit: Payer: Self-pay | Admitting: Obstetrics and Gynecology

## 2022-12-19 DIAGNOSIS — R928 Other abnormal and inconclusive findings on diagnostic imaging of breast: Secondary | ICD-10-CM

## 2022-12-31 ENCOUNTER — Ambulatory Visit
Admission: RE | Admit: 2022-12-31 | Discharge: 2022-12-31 | Disposition: A | Discharge status: Home / Self Care | Payer: Self-pay | Source: Ambulatory Visit | Attending: Obstetrics and Gynecology | Admitting: Obstetrics and Gynecology

## 2022-12-31 DIAGNOSIS — R928 Other abnormal and inconclusive findings on diagnostic imaging of breast: Secondary | ICD-10-CM

## 2023-01-02 ENCOUNTER — Other Ambulatory Visit: Payer: Self-pay

## 2023-01-02 DIAGNOSIS — R928 Other abnormal and inconclusive findings on diagnostic imaging of breast: Secondary | ICD-10-CM

## 2023-04-30 ENCOUNTER — Telehealth: Payer: Self-pay | Admitting: *Deleted

## 2023-07-09 ENCOUNTER — Ambulatory Visit
Admission: RE | Admit: 2023-07-09 | Discharge: 2023-07-09 | Disposition: A | Discharge status: Home / Self Care | Payer: Self-pay | Source: Ambulatory Visit | Attending: Obstetrics and Gynecology | Admitting: Obstetrics and Gynecology

## 2023-07-09 DIAGNOSIS — R928 Other abnormal and inconclusive findings on diagnostic imaging of breast: Secondary | ICD-10-CM | POA: Insufficient documentation

## 2023-07-15 ENCOUNTER — Ambulatory Visit: Payer: Self-pay | Admitting: Obstetrics and Gynecology

## 2023-07-15 DIAGNOSIS — R928 Other abnormal and inconclusive findings on diagnostic imaging of breast: Secondary | ICD-10-CM

## 2023-08-27 ENCOUNTER — Other Ambulatory Visit: Payer: Self-pay

## 2023-08-27 DIAGNOSIS — R928 Other abnormal and inconclusive findings on diagnostic imaging of breast: Secondary | ICD-10-CM

## 2023-10-23 ENCOUNTER — Telehealth: Payer: Self-pay | Admitting: *Deleted

## 2023-12-16 ENCOUNTER — Telehealth: Payer: Self-pay
